# Patient Record
Sex: Male | Born: 1980 | Race: Black or African American | Hispanic: No | Marital: Married | Smoking: Current some day smoker
Health system: Southern US, Community
[De-identification: ages and names within clinical notes are randomized; demographics above are authoritative.]

---

## 2008-08-04 ENCOUNTER — Emergency Department (HOSPITAL_COMMUNITY): Admission: EM | Admit: 2008-08-04 | Discharge: 2008-08-04 | Payer: Self-pay | Admitting: Emergency Medicine

## 2009-05-15 ENCOUNTER — Emergency Department (HOSPITAL_COMMUNITY): Admission: EM | Admit: 2009-05-15 | Discharge: 2009-05-15 | Payer: Self-pay | Admitting: Emergency Medicine

## 2011-10-06 ENCOUNTER — Emergency Department (HOSPITAL_COMMUNITY)
Admission: EM | Admit: 2011-10-06 | Discharge: 2011-10-06 | Disposition: A | Discharge status: Home / Self Care | Payer: No Typology Code available for payment source | Attending: Emergency Medicine | Admitting: Emergency Medicine

## 2011-10-06 ENCOUNTER — Encounter (HOSPITAL_COMMUNITY): Payer: Self-pay | Admitting: Emergency Medicine

## 2011-10-06 ENCOUNTER — Emergency Department (HOSPITAL_COMMUNITY): Payer: No Typology Code available for payment source

## 2011-10-06 DIAGNOSIS — M542 Cervicalgia: Secondary | ICD-10-CM | POA: Insufficient documentation

## 2011-10-06 DIAGNOSIS — R51 Headache: Secondary | ICD-10-CM | POA: Insufficient documentation

## 2011-10-06 DIAGNOSIS — F17200 Nicotine dependence, unspecified, uncomplicated: Secondary | ICD-10-CM | POA: Insufficient documentation

## 2011-10-06 DIAGNOSIS — M545 Low back pain, unspecified: Secondary | ICD-10-CM | POA: Insufficient documentation

## 2011-10-06 DIAGNOSIS — M546 Pain in thoracic spine: Secondary | ICD-10-CM | POA: Insufficient documentation

## 2011-10-06 MED ORDER — HYDROCODONE-ACETAMINOPHEN 5-325 MG PO TABS: 2.0000 | ORAL_TABLET | Freq: Four times a day (QID) | ORAL | Status: AC | PRN

## 2011-10-06 MED ORDER — DIAZEPAM 5 MG PO TABS: 5.0000 mg | ORAL_TABLET | Freq: Two times a day (BID) | ORAL | Status: AC
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
## 2011-10-07 NOTE — ED Provider Notes (Signed)
Medical screening examination/treatment/procedure(s) were performed by non-physician practitioner and as supervising physician I was immediately available for consultation/collaboration.  Raeford Razor, MD 10/07/11 1210

## 2011-10-11 ENCOUNTER — Emergency Department (HOSPITAL_COMMUNITY): Payer: No Typology Code available for payment source

## 2011-10-11 ENCOUNTER — Encounter (HOSPITAL_COMMUNITY): Payer: Self-pay | Admitting: Family Medicine

## 2011-10-11 ENCOUNTER — Emergency Department (HOSPITAL_COMMUNITY)
Admission: EM | Admit: 2011-10-11 | Discharge: 2011-10-11 | Disposition: A | Discharge status: Home / Self Care | Payer: No Typology Code available for payment source | Attending: Emergency Medicine | Admitting: Emergency Medicine

## 2011-10-11 DIAGNOSIS — M549 Dorsalgia, unspecified: Secondary | ICD-10-CM

## 2011-10-11 DIAGNOSIS — H539 Unspecified visual disturbance: Secondary | ICD-10-CM | POA: Insufficient documentation

## 2011-10-11 DIAGNOSIS — R51 Headache: Secondary | ICD-10-CM

## 2011-10-11 DIAGNOSIS — R110 Nausea: Secondary | ICD-10-CM | POA: Insufficient documentation

## 2011-10-11 MED ORDER — OXYCODONE-ACETAMINOPHEN 5-325 MG PO TABS: 1.0000 | ORAL_TABLET | Freq: Four times a day (QID) | ORAL | Status: AC | PRN

## 2011-10-11 MED ORDER — OXYCODONE-ACETAMINOPHEN 5-325 MG PO TABS: 2.0000 | ORAL_TABLET | Freq: Once | ORAL | Status: DC

## 2011-10-11 MED ORDER — CYCLOBENZAPRINE HCL 10 MG PO TABS: 10.0000 mg | ORAL_TABLET | Freq: Two times a day (BID) | ORAL | Status: DC | PRN
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
## 2011-10-11 NOTE — ED Notes (Signed)
Pt ambulatory from [facility] to exam room.

## 2011-10-12 NOTE — ED Provider Notes (Signed)
Medical screening examination/treatment/procedure(s) were performed by non-physician practitioner and as supervising physician I was immediately available for consultation/collaboration. Devoria Albe, MD, FACEP   Ward Givens, MD 10/12/11 [phone]

## 2013-09-12 ENCOUNTER — Encounter (HOSPITAL_COMMUNITY): Payer: Self-pay | Admitting: Emergency Medicine

## 2013-09-12 ENCOUNTER — Emergency Department (HOSPITAL_COMMUNITY): Payer: Managed Care, Other (non HMO)

## 2013-09-12 ENCOUNTER — Emergency Department (HOSPITAL_COMMUNITY)
Admission: EM | Admit: 2013-09-12 | Discharge: 2013-09-12 | Disposition: A | Discharge status: Home / Self Care | Payer: Managed Care, Other (non HMO) | Attending: Emergency Medicine | Admitting: Emergency Medicine

## 2013-09-12 DIAGNOSIS — S46909A Unspecified injury of unspecified muscle, fascia and tendon at shoulder and upper arm level, unspecified arm, initial encounter: Secondary | ICD-10-CM | POA: Insufficient documentation

## 2013-09-12 DIAGNOSIS — Z79899 Other long term (current) drug therapy: Secondary | ICD-10-CM | POA: Insufficient documentation

## 2013-09-12 DIAGNOSIS — Y9375 Activity, martial arts: Secondary | ICD-10-CM | POA: Insufficient documentation

## 2013-09-12 DIAGNOSIS — S43005A Unspecified dislocation of left shoulder joint, initial encounter: Secondary | ICD-10-CM

## 2013-09-12 DIAGNOSIS — S4980XA Other specified injuries of shoulder and upper arm, unspecified arm, initial encounter: Secondary | ICD-10-CM | POA: Diagnosis present

## 2013-09-12 DIAGNOSIS — X500XXA Overexertion from strenuous movement or load, initial encounter: Secondary | ICD-10-CM | POA: Insufficient documentation

## 2013-09-12 DIAGNOSIS — Y929 Unspecified place or not applicable: Secondary | ICD-10-CM | POA: Diagnosis not present

## 2013-09-12 DIAGNOSIS — F17200 Nicotine dependence, unspecified, uncomplicated: Secondary | ICD-10-CM | POA: Diagnosis not present

## 2013-09-12 DIAGNOSIS — S43016A Anterior dislocation of unspecified humerus, initial encounter: Secondary | ICD-10-CM | POA: Diagnosis not present

## 2013-09-12 MED ORDER — OXYCODONE-ACETAMINOPHEN 5-325 MG PO TABS: 1.0000 | ORAL_TABLET | ORAL | Status: AC | PRN

## 2013-09-12 MED ORDER — OXYCODONE-ACETAMINOPHEN 5-325 MG PO TABS
2.0000 | ORAL_TABLET | Freq: Once | ORAL | Status: AC
  Administered 2013-09-12: 2 via ORAL
  Filled 2013-09-12: qty 2

## 2013-09-12 NOTE — ED Provider Notes (Signed)
CSN: [id number]     Arrival date & time 09/12/13  1904 History   First MD Initiated Contact with Patient 09/12/13 1936     Chief Complaint  Patient presents with  . 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 He had an acute hyperextension at the shoulder with abduction, immediate onset of pain and deformity. Denies numbness or tingling or weakness at the hand. Symptoms are constant and worse with range of motion of the left upper extremity.  The history is provided by the patient.    History reviewed. No pertinent past medical history. History reviewed. No pertinent past surgical history. No family history on file. History  Substance Use Topics  . Smoking status: Current Some Day Smoker  . Smokeless tobacco: Not on file  . Alcohol Use: Yes     Comment: socially    Review of Systems  Gastrointestinal: Negative for nausea and vomiting.  Musculoskeletal: Positive for joint swelling (L shoulder). Negative for back pain and neck pain.  Neurological: Negative for weakness and numbness.      Allergies  Aspirin  Home Medications   Prior to Admission medications   Medication Sig Start Date End Date Taking? Authorizing Provider  cyclobenzaprine (FLEXERIL) 10 MG tablet Take 1 tablet (10 mg total) by mouth 2 (two) times daily as needed for muscle spasms. 10/11/11   Tia Oliveri, PA-C  diazepam (VALIUM) 5 MG tablet Take 1 tablet (5 mg total) by mouth 2 (two) times daily. 10/06/11   Santiago Glad, PA-C  HYDROcodone-acetaminophen (NORCO/VICODIN) 5-325 MG per tablet Take 2 tablets by mouth every 6 (six) hours as needed for pain. 10/06/11   Heather Laisure, PA-C  ibuprofen (ADVIL,MOTRIN) 200 MG tablet  Take 400 mg by mouth every 6 (six) hours as needed. For pain    Historical Provider, MD  oxyCODONE-acetaminophen (PERCOCET) 5-325 MG per tablet Take 1 tablet by mouth every 4 (four) hours as needed. 09/12/13   Vida Roller, MD  oxyCODONE-acetaminophen (PERCOCET/ROXICET) 5-325 MG per tablet Take 1 tablet by mouth every 6 (six) hours as needed for pain. 10/11/11   Tia Oliveri, PA-C   BP 130/81  Pulse 69  Temp(Src) 98.3 F (36.8 C) (Oral)  Resp 17  SpO2 100% Physical Exam  Nursing note and vitals reviewed. Constitutional: He appears well-developed and well-nourished. No distress.  HENT:  Head: Normocephalic and atraumatic.  Mouth/Throat: Oropharynx is clear and moist.  Eyes: Conjunctivae are normal. Right eye exhibits no discharge. Left eye exhibits no discharge. No scleral icterus.  Cardiovascular: Normal rate.   No murmur heard. Pulmonary/Chest: Effort normal. No respiratory distress.  Musculoskeletal:  Tenderness and deformity of the left shoulder consistent with anterior inferior shoulder dislocation. Normal range of motion at elbow wrist and hand on the left.  Neurological: He is alert. Coordination normal.  Normal sensation and strength to the left upper extremity distal to the dislocation  Skin: Skin is warm and dry. No rash noted. He is not diaphoretic.    ED Course  Procedures (including critical care time) Labs Review Labs Reviewed - No data to display  Imaging Review Dg Shoulder Left  09/12/2013   CLINICAL DATA:  Status post reduction.  EXAM: LEFT SHOULDER - 2+ VIEW  COMPARISON:  Same day.  FINDINGS: Successful reduction of  anterior dislocation of proximal left humerus noted on prior exam. No fracture is noted.  IMPRESSION: Successful reduction of anterior shoulder dislocation.   Electronically Signed   By: Roque Lias M.D.   On: 09/12/2013 20:13   Dg Shoulder Left  09/12/2013   CLINICAL DATA:  Left shoulder pain  EXAM: LEFT SHOULDER - 2+ VIEW  COMPARISON:  None.  FINDINGS:  There is complete anterior subluxation of the humeral head with respect to the glenoid. No fracture.  IMPRESSION: Anterior glenohumeral dislocation.   Electronically Signed   By: Maryclare Bean M.D.   On: 09/12/2013 19:40    Reduction of dislocation Date/Time: 8:41 PM Performed by: Vida Roller Authorized by: Vida Roller Consent: Verbal consent obtained. Risks and benefits: risks, benefits and alternatives were discussed Consent given by: patient Required items: required blood products, implants, devices, and special equipment available Time out: Immediately prior to procedure a "time out" was called to verify the correct patient, procedure, equipment, support staff and site/side marked as required.  Patient sedated: No  Vitals: Vital signs were monitored during sedation. Patient tolerance: Patient tolerated the procedure well with no immediate complications. Joint: L shoulder Reduction technique: Traction / Manipulation    MDM   Final diagnoses:  Shoulder dislocation, left, initial encounter    Initial imaging confirms dislocation of the left shoulder. The patient had shoulder relocation done without sedation successfully times one attempt, post reduction films obtained to rule out Hill-Sachs deformity or other abnormality.  Sling, immobilizer, percocet  Repeat imaging without findings.  Successful dislocation reduction.  Meds given in ED:  Medications  oxyCODONE-acetaminophen (PERCOCET/ROXICET) 5-325 MG per tablet 2 tablet (2 tablets Oral Given 09/12/13 2024)    New Prescriptions   OXYCODONE-ACETAMINOPHEN (PERCOCET) 5-325 MG PER TABLET    Take 1 tablet by mouth every 4 (four) hours as needed.        Vida Roller, MD 09/12/13 2041
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
## 2013-09-12 NOTE — Discharge Instructions (Signed)
Please call your doctor for a followup appointment within 24-48 hours. When you talk to your doctor please let them know that you were seen in the emergency department and have them acquire all of your records so that they can discuss the findings with you and formulate a treatment plan to fully care for your new and ongoing problems. ° °

## 2018-08-14 ENCOUNTER — Other Ambulatory Visit: Payer: Self-pay

## 2018-08-14 DIAGNOSIS — Z20822 Contact with and (suspected) exposure to covid-19: Secondary | ICD-10-CM

## 2018-08-16 LAB — NOVEL CORONAVIRUS, NAA: SARS-CoV-2, NAA: NOT DETECTED

## 2018-09-27 ENCOUNTER — Other Ambulatory Visit: Payer: Self-pay

## 2018-09-27 DIAGNOSIS — Z20822 Contact with and (suspected) exposure to covid-19: Secondary | ICD-10-CM

## 2018-09-29 LAB — NOVEL CORONAVIRUS, NAA: SARS-CoV-2, NAA: NOT DETECTED

## 2020-09-10 ENCOUNTER — Encounter (HOSPITAL_COMMUNITY): Payer: Self-pay

## 2020-09-10 ENCOUNTER — Emergency Department (HOSPITAL_COMMUNITY)
Admission: EM | Admit: 2020-09-10 | Discharge: 2020-09-10 | Disposition: A | Discharge status: Home / Self Care | Payer: BLUE CROSS/BLUE SHIELD | Attending: Emergency Medicine | Admitting: Emergency Medicine

## 2020-09-10 ENCOUNTER — Emergency Department (HOSPITAL_COMMUNITY): Payer: BLUE CROSS/BLUE SHIELD

## 2020-09-10 ENCOUNTER — Other Ambulatory Visit: Payer: Self-pay

## 2020-09-10 DIAGNOSIS — Y990 Civilian activity done for income or pay: Secondary | ICD-10-CM | POA: Insufficient documentation

## 2020-09-10 DIAGNOSIS — S43004A Unspecified dislocation of right shoulder joint, initial encounter: Secondary | ICD-10-CM | POA: Insufficient documentation

## 2020-09-10 DIAGNOSIS — X501XXA Overexertion from prolonged static or awkward postures, initial encounter: Secondary | ICD-10-CM | POA: Insufficient documentation

## 2020-09-10 DIAGNOSIS — S4991XA Unspecified injury of right shoulder and upper arm, initial encounter: Secondary | ICD-10-CM | POA: Diagnosis present

## 2020-09-10 DIAGNOSIS — F17200 Nicotine dependence, unspecified, uncomplicated: Secondary | ICD-10-CM | POA: Diagnosis not present

## 2020-09-10 MED ORDER — CYCLOBENZAPRINE HCL 10 MG PO TABS: 10.0000 mg | ORAL_TABLET | Freq: Two times a day (BID) | ORAL | 0 refills | Status: AC | PRN

## 2020-09-10 MED ORDER — PROPOFOL 10 MG/ML IV BOLUS
1.0000 mg/kg | Freq: Once | INTRAVENOUS | Status: AC
  Administered 2020-09-10: 93.4 mg via INTRAVENOUS
  Filled 2020-09-10: qty 20

## 2020-09-10 MED ORDER — HYDROMORPHONE HCL 1 MG/ML IJ SOLN
1.0000 mg | Freq: Once | INTRAMUSCULAR | Status: AC
  Administered 2020-09-10: 1 mg via INTRAVENOUS
  Filled 2020-09-10: qty 1

## 2020-09-10 NOTE — ED Notes (Signed)
XRAY contacted per RN request for portable scan

## 2020-09-10 NOTE — ED Notes (Signed)
ED Provider at bedside. 

## 2020-09-10 NOTE — ED Notes (Signed)
Patient ready for conscious sedation for right shoulder reduction. Staff present: Reatha Armour, Ortho Tech Jamas Lav, RN Dr. Dalene Seltzer

## 2020-09-10 NOTE — ED Notes (Signed)
Pt drowsy, awake, and oriented.

## 2020-09-10 NOTE — ED Triage Notes (Signed)
Pt BIB EMS from work. Pt states he has dislocated his right shoulder, this has happened before to same shoulder.

## 2020-09-10 NOTE — ED Notes (Signed)
An After Visit Summary was printed and given to the patient. Discharge instructions given and no further questions at this time.  

## 2020-09-11 NOTE — ED Provider Notes (Signed)
[facility]-EMERGENCY DEPT Provider Note   CSN: [id number] Arrival date & time: 09/10/20  1812     History Chief Complaint  Patient presents with   Shoulder Injury    Andrew Mercado is a 40 y.o. male.  HPI     40yo male presents with concern for right shoulder pain and concern for dislocation.  2 weeks ago felt like right shoulder dislocated and went back in on its own. Today, was trying to put a picture up and pushed arm against the wall and felt right shoulder go out of place again. Pain is severe, worse with movement.  No other acute concerns. No numbness or weakness. Had left shoulder dislocated in the past.   History reviewed. No pertinent past medical history.  There are no problems to display for this patient.   History reviewed. No pertinent surgical history.     History reviewed. No pertinent family history.  Social History   Tobacco Use   Smoking status: Some Days  Substance Use Topics   Alcohol use: Yes    Comment: socially   Drug use: No    Home Medications Prior to Admission medications   Medication Sig Start Date End Date Taking? Authorizing Provider  cyclobenzaprine (FLEXERIL) 10 MG tablet Take 1 tablet (10 mg total) by mouth 2 (two) times daily as needed for muscle spasms. 09/10/20  Yes Alvira Monday, MD  diazepam (VALIUM) 5 MG tablet Take 1 tablet (5 mg total) by mouth 2 (two) times daily. 10/06/11   Santiago Glad, PA-C  HYDROcodone-acetaminophen (NORCO/VICODIN) 5-325 MG per tablet Take 2 tablets by mouth every 6 (six) hours as needed for pain. 10/06/11   Santiago Glad, PA-C  ibuprofen (ADVIL,MOTRIN) 200 MG tablet Take 400 mg by mouth every 6 (six) hours as needed. For pain    [provider]  oxyCODONE-acetaminophen (PERCOCET) 5-325 MG per tablet Take 1 tablet by mouth every 4 (four) hours as needed. 09/12/13   Eber Hong, MD  oxyCODONE-acetaminophen (PERCOCET/ROXICET) 5-325 MG per tablet Take 1 tablet by  mouth every 6 (six) hours as needed for pain. 10/11/11   Oliveri, Tia L, PA-C    Allergies    Aspirin  Review of Systems   Review of Systems  Constitutional:  Negative for fever.  HENT:  Negative for sore throat.   Respiratory:  Negative for cough and shortness of breath.   Musculoskeletal:  Positive for arthralgias. Negative for back pain.  Neurological:  Negative for headaches.   Physical Exam Updated Vital Signs BP (!) 142/79   Pulse 79   Temp 98.1 F (36.7 C) (Oral)   Resp 18   Ht 5\' 11"  (1.803 m)   Wt 93.4 kg   SpO2 99%   BMI 28.73 kg/m   Physical Exam Vitals and nursing note reviewed.  Constitutional:      General: He is not in acute distress.    Appearance: Normal appearance. He is not ill-appearing, toxic-appearing or diaphoretic.  HENT:     Head: Normocephalic.     Mouth/Throat:     Mouth: Mucous membranes are moist.     Pharynx: No oropharyngeal exudate.  Eyes:     Conjunctiva/sclera: Conjunctivae normal.  Cardiovascular:     Rate and Rhythm: Normal rate and regular rhythm.     Pulses: Normal pulses.  Pulmonary:     Effort: Pulmonary effort is normal. No respiratory distress.  Musculoskeletal:        General: Tenderness and deformity (right shoulder) present. No  signs of injury.     Cervical back: No rigidity.  Skin:    General: Skin is warm and dry.     Coloration: Skin is not jaundiced or pale.  Neurological:     General: No focal deficit present.     Mental Status: He is alert and oriented to person, place, and time.    ED Results / Procedures / Treatments   Labs (all labs ordered are listed, but only abnormal results are displayed) Labs Reviewed - No data to display  EKG None  Radiology DG Shoulder Right  Result Date: 09/10/2020 CLINICAL DATA:  Postreduction EXAM: RIGHT SHOULDER - 2+ VIEW COMPARISON:  09/10/2020 FINDINGS: Reduction of shoulder dislocation. AC joint is intact. No visible fracture fragment IMPRESSION: Reduction of anterior  shoulder dislocation Electronically Signed   By: Jasmine Pang M.D.   On: 09/10/2020 21:16   DG Shoulder Right  Result Date: 09/10/2020 CLINICAL DATA:  Pain.  Dislocated shoulder. EXAM: RIGHT SHOULDER - 2+ VIEW COMPARISON:  None. FINDINGS: Anteriorly dislocated right shoulder with query of Hill-Sachs deformity. Glenoid fracture not excluded. IMPRESSION: Anteriorly dislocated right shoulder with query of Hill-Sachs deformity. Glenoid fracture not excluded. Electronically Signed   By: Tish Frederickson M.D.   On: 09/10/2020 19:52    Procedures .Sedation  Date/Time: 09/11/2020 9:38 AM Performed by: Alvira Monday, MD Authorized by: Alvira Monday, MD   Consent:    Consent obtained:  Written   Consent given by:  Patient   Risks discussed:  Allergic reaction, dysrhythmia, inadequate sedation, nausea, vomiting, respiratory compromise necessitating ventilatory assistance and intubation, prolonged sedation necessitating reversal and prolonged hypoxia resulting in organ damage   Alternatives discussed:  Analgesia without sedation Universal protocol:    Immediately prior to procedure, a time out was called: yes     Patient identity confirmed:  Verbally with patient Indications:    Procedure performed:  Dislocation reduction   Procedure necessitating sedation performed by:  Physician performing sedation Pre-sedation assessment:    Time since last food or drink:  4   ASA classification: class 1 - normal, healthy patient     Mouth opening:  3 or more finger widths   Thyromental distance:  3 finger widths   Mallampati score:  III - soft palate, base of uvula visible   Neck mobility: normal     Pre-sedation assessments completed and reviewed: airway patency, cardiovascular function, hydration status, mental status, nausea/vomiting, pain level, respiratory function and temperature   Immediate pre-procedure details:    Reassessment: Patient reassessed immediately prior to procedure   Procedure  details (see MAR for exact dosages):    Total Provider sedation time (minutes):  20 Reduction of dislocation  Date/Time: 09/11/2020 9:40 AM Performed by: Alvira Monday, MD Authorized by: Alvira Monday, MD  Consent: Verbal consent obtained. Written consent obtained. Risks and benefits: risks, benefits and alternatives were discussed Required items: required blood products, implants, devices, and special equipment available Patient identity confirmed: verbally with patient Time out: Immediately prior to procedure a "time out" was called to verify the correct patient, procedure, equipment, support staff and site/side marked as required.  Sedation: Patient sedated: yes Sedation type: moderate (conscious) sedation Sedatives: propofol  Patient tolerance: patient tolerated the procedure well with no immediate complications     Medications Ordered in ED Medications  HYDROmorphone (DILAUDID) injection 1 mg (1 mg Intravenous Given 09/10/20 1832)  HYDROmorphone (DILAUDID) injection 1 mg (1 mg Intravenous Given 09/10/20 2007)  propofol (DIPRIVAN) 10 mg/mL bolus/IV push 93.4 mg (  93.4 mg Intravenous Given 09/10/20 2033)    ED Course  I have reviewed the triage vital signs and the nursing notes.  Pertinent labs & imaging results that were available during my care of the patient were reviewed by me and considered in my medical decision making (see chart for details).    MDM Rules/Calculators/A&P                            40yo male presents with concern for right shoulder pain and concern for dislocation.   NV intact, XR confirms dislocation.  Attempted reduction without sedation, then sedated and reduced as above. NV intact. Placed in sling and recommend Orthopedic follow up.   Final Clinical Impression(s) / ED Diagnoses Final diagnoses:  Dislocation of right shoulder joint, initial encounter    Rx / DC Orders ED Discharge Orders          Ordered    cyclobenzaprine (FLEXERIL) 10  MG tablet  2 times daily PRN        09/10/20 2232             Alvira Monday, MD 09/11/20 [phone]

## 2022-11-28 IMAGING — DX DG SHOULDER 2+V*R*
2 series · 2 of 2 positions shown · non-contrast
Comparison: 09/10/2020

CLINICAL DATA: Postreduction

EXAM:
RIGHT SHOULDER - 2+ VIEW

[shoulder axial]
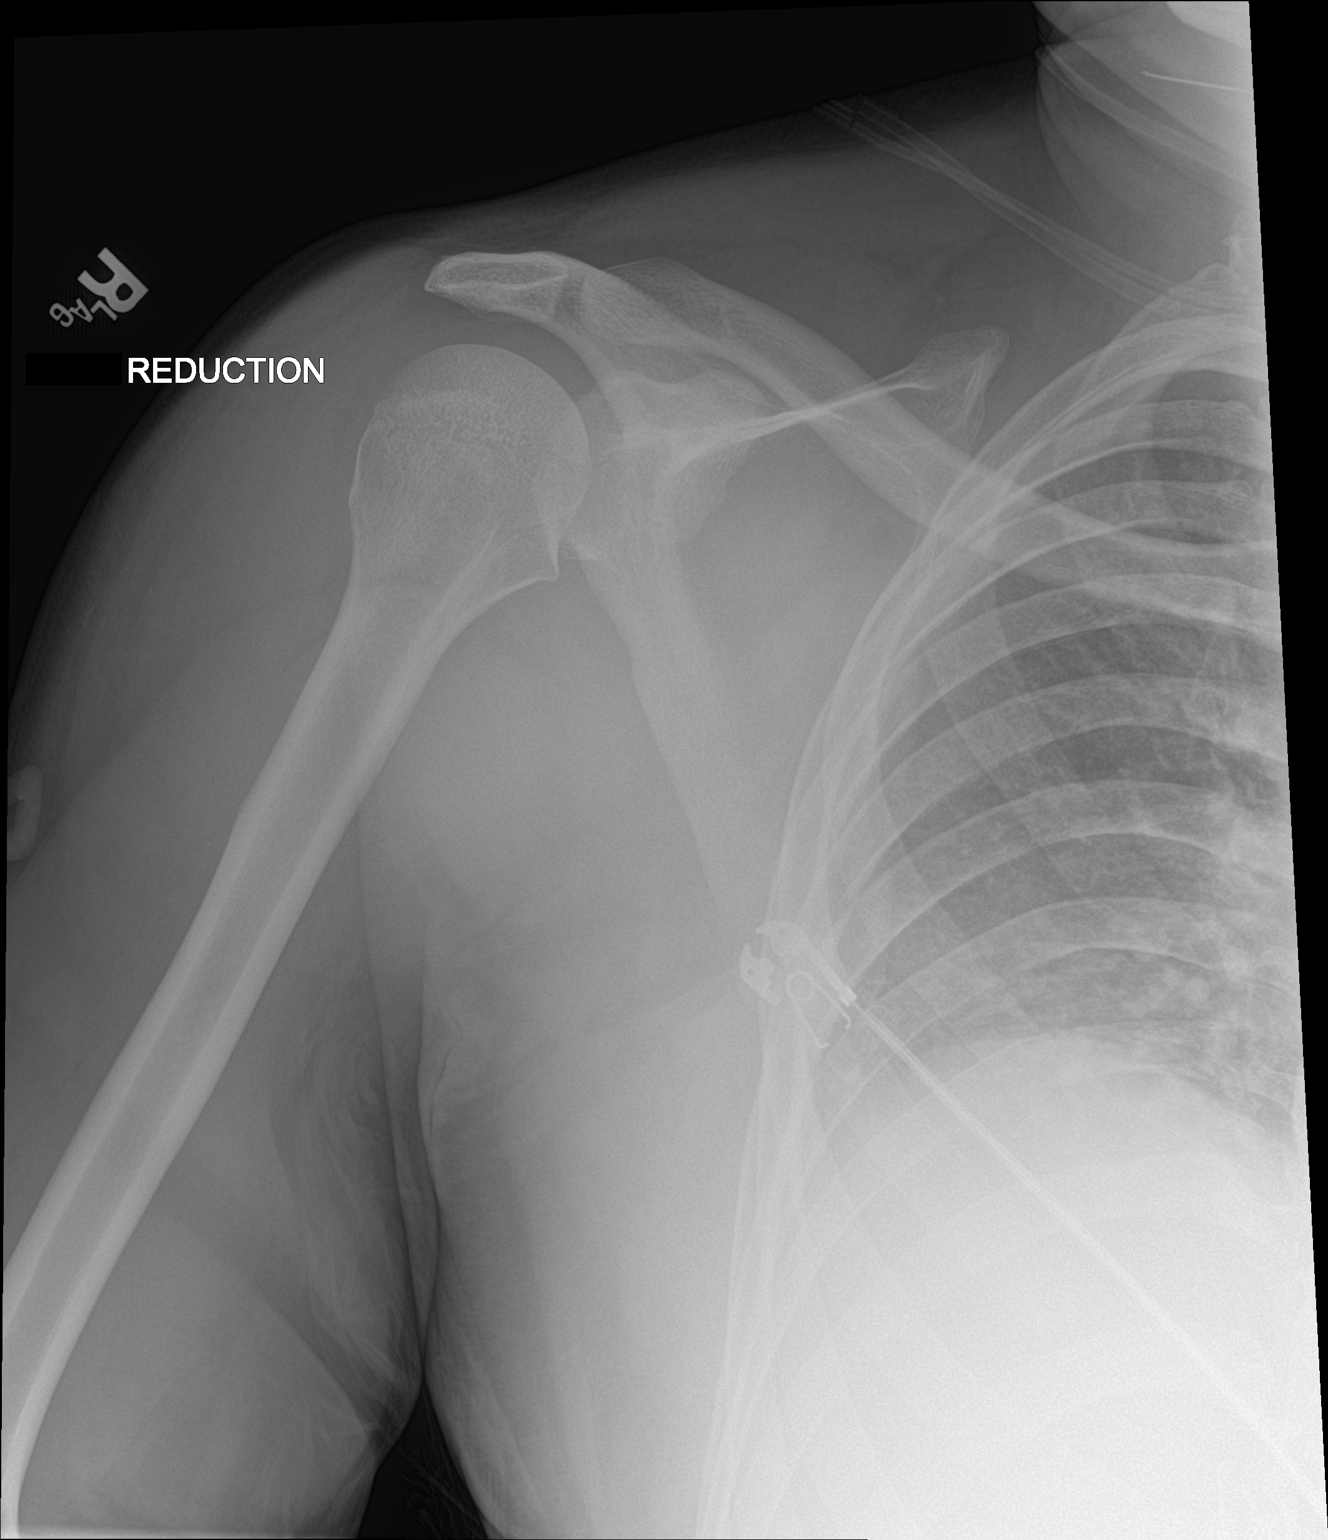

[shoulder swimmer]
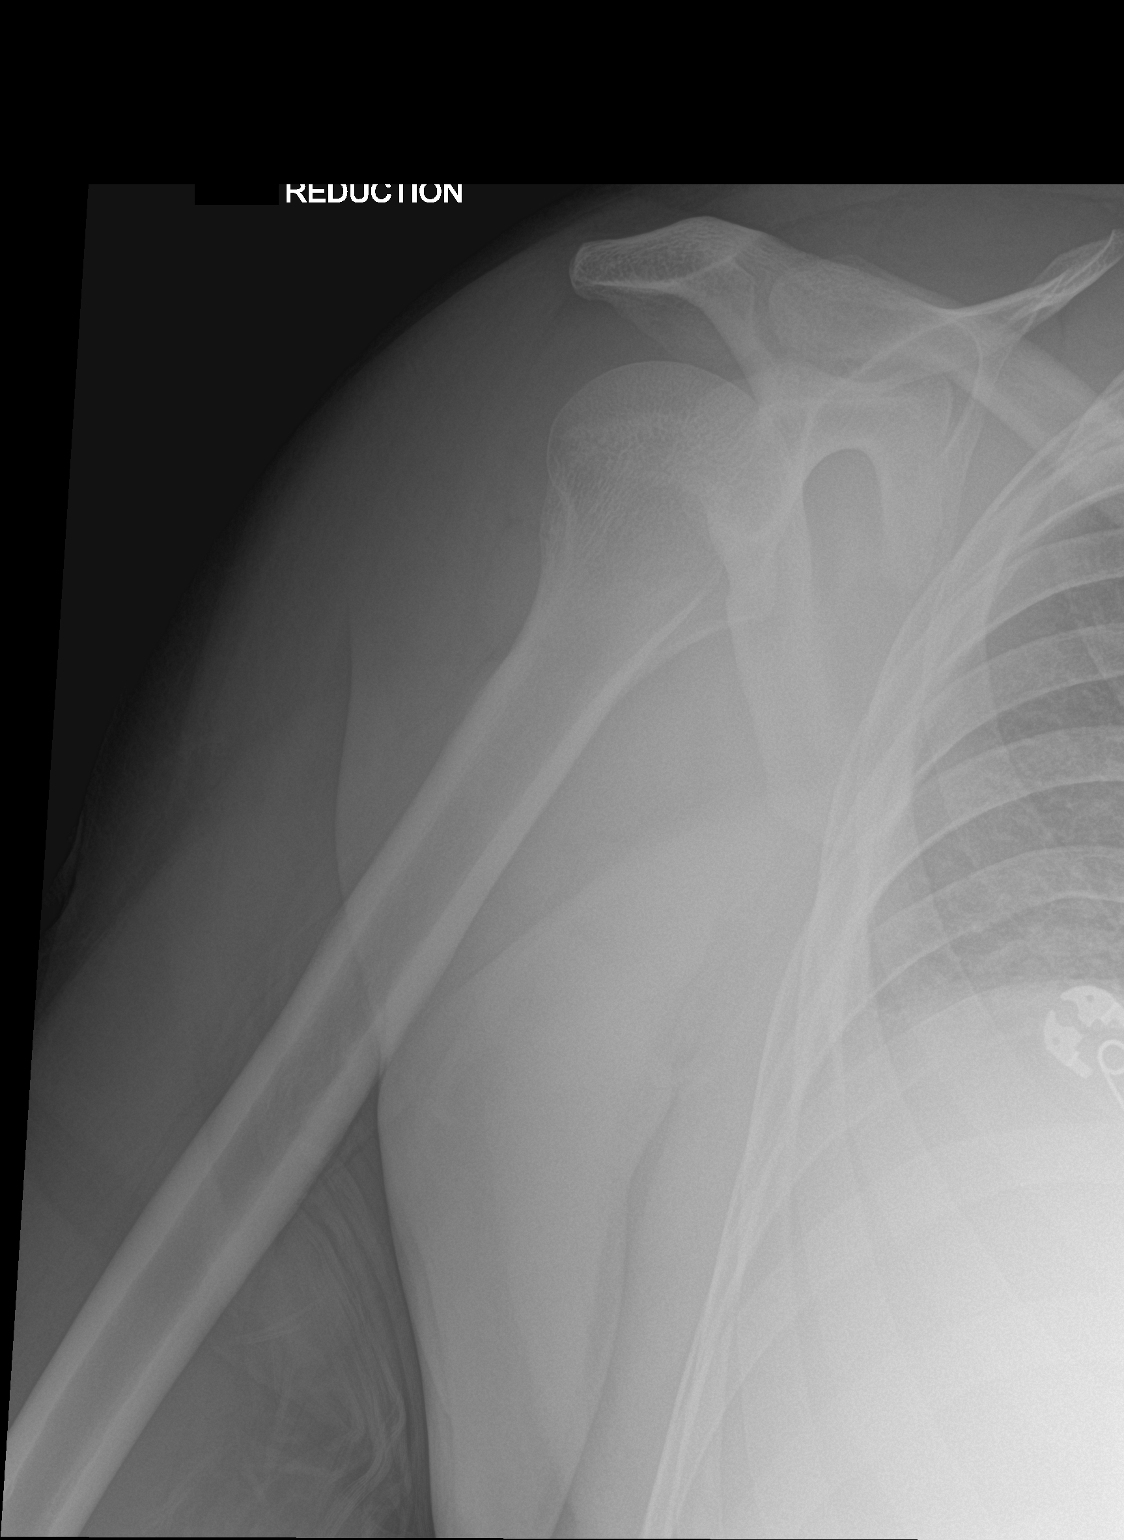

[2 of 2 positions shown; findings below may reference images not displayed]

FINDINGS: Reduction of shoulder dislocation. AC joint is intact. No visible
fracture fragment
IMPRESSION: Reduction of anterior shoulder dislocation

## 2022-11-28 IMAGING — CR DG SHOULDER 2+V*R*
3 series · 3 of 3 positions shown · non-contrast
Comparison: None.

CLINICAL DATA: Pain.  Dislocated shoulder.

EXAM:
RIGHT SHOULDER - 2+ VIEW

[x shoulder ap right (1 of 3)]
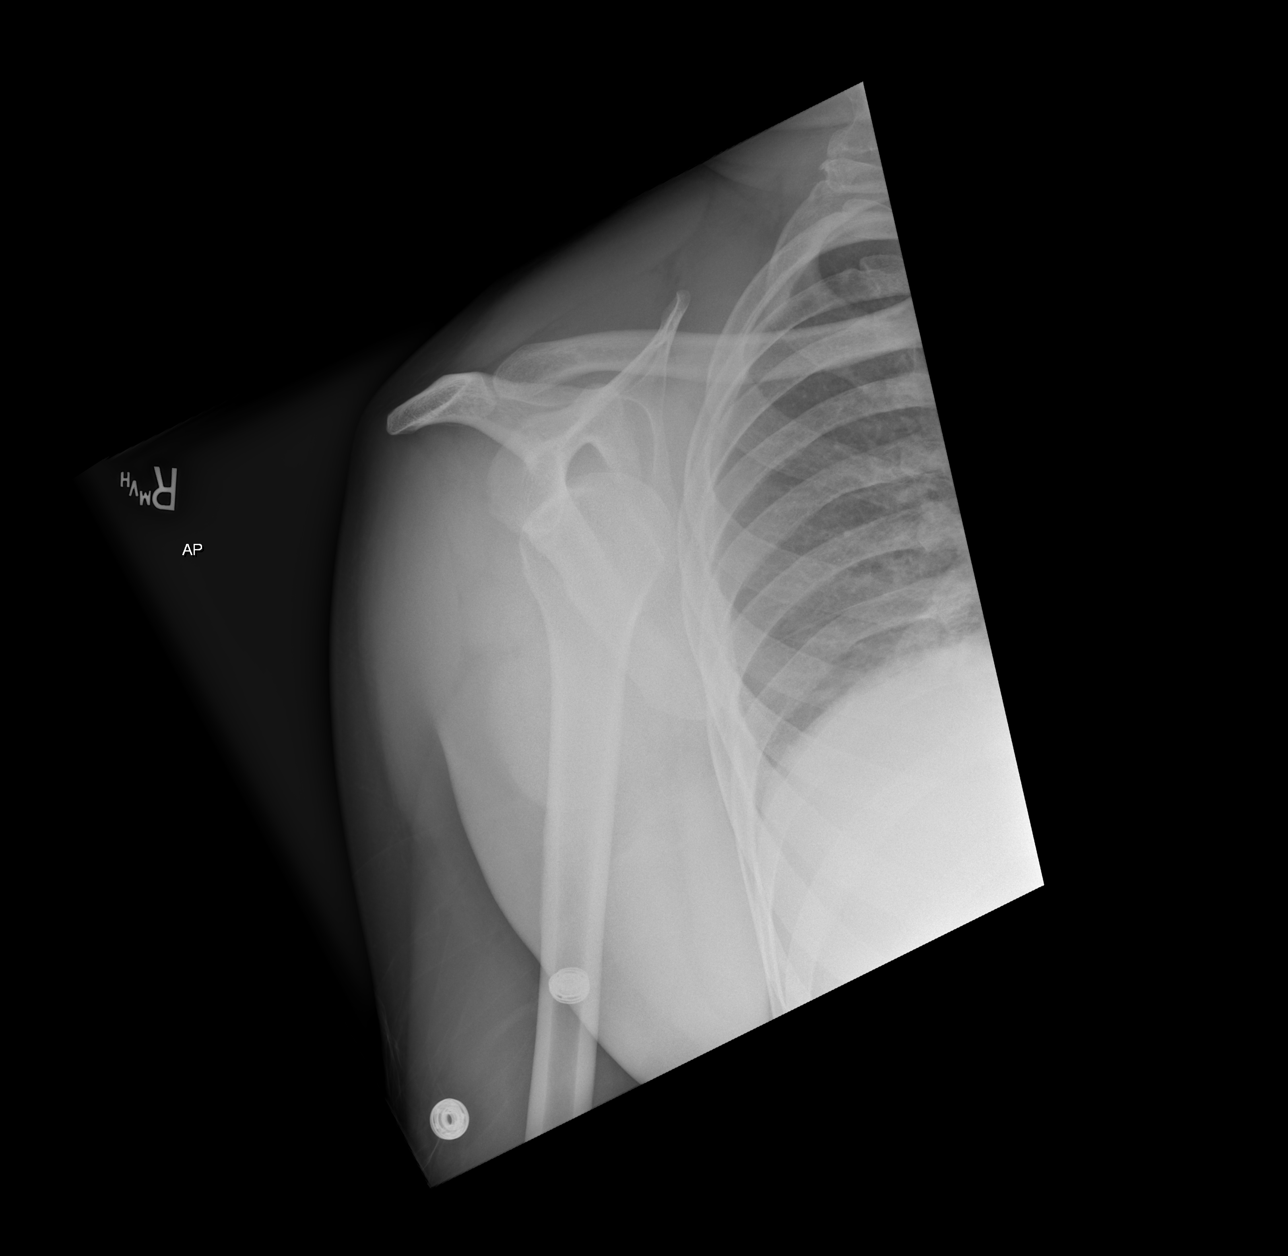

[x shoulder ap right (2 of 3)]
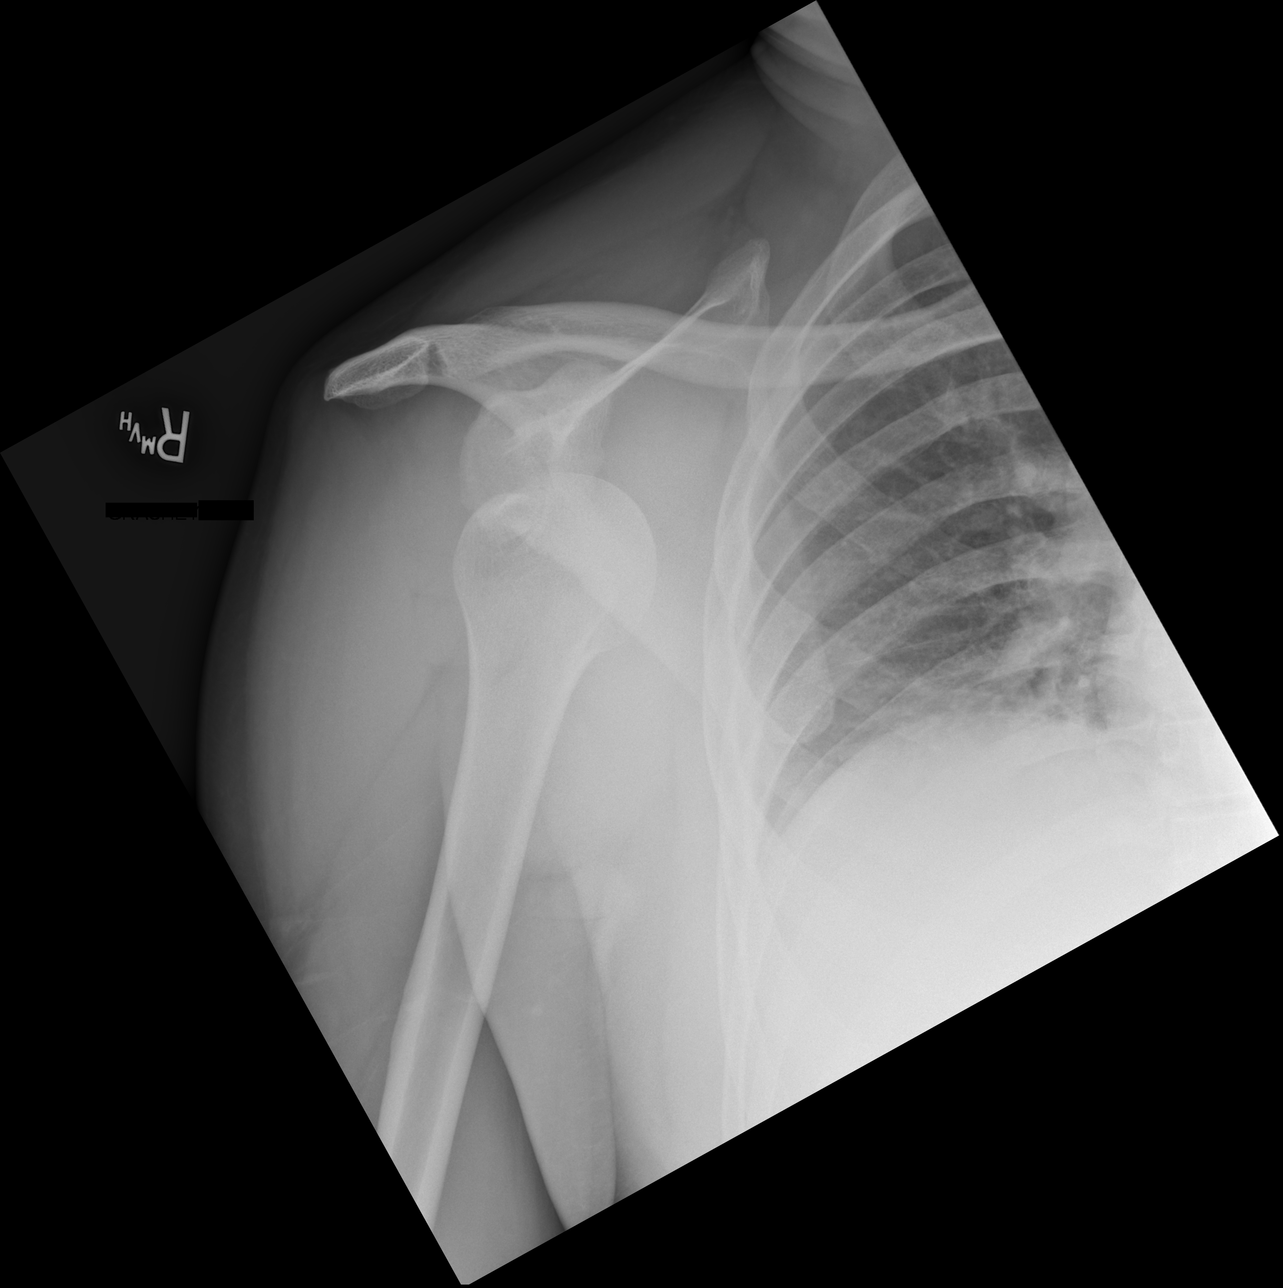

[x shoulder ap right (3 of 3)]
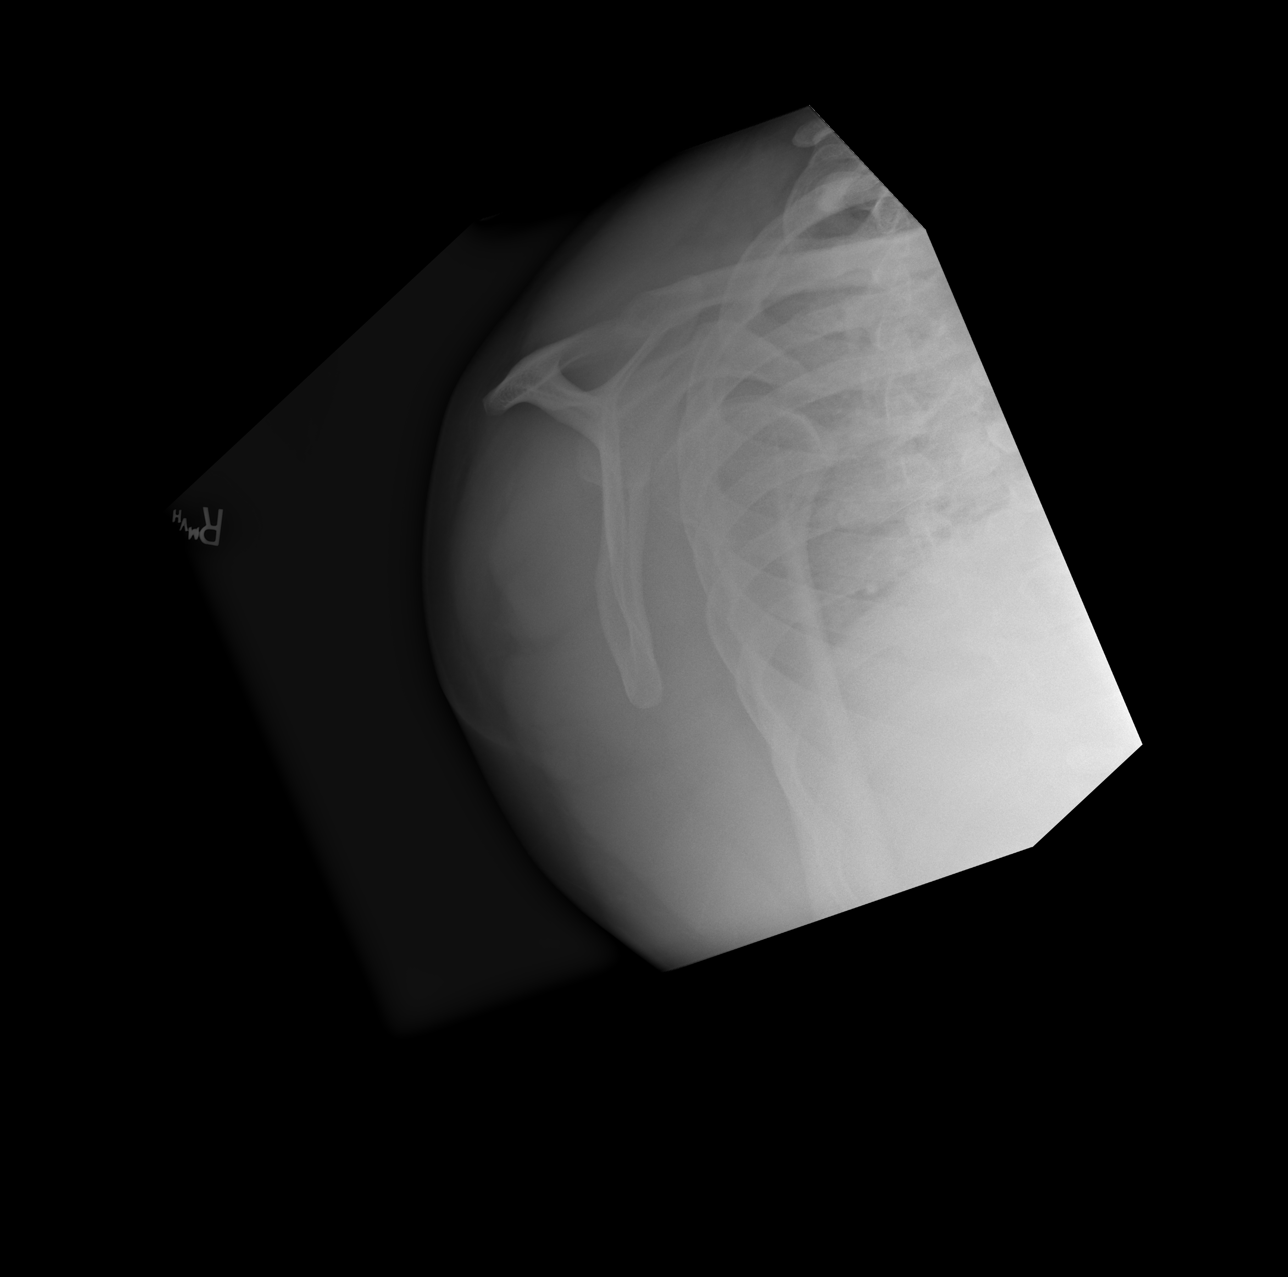

[3 of 3 positions shown; findings below may reference images not displayed]

FINDINGS: Anteriorly dislocated right shoulder with query of Hill-Sachs
deformity. Glenoid fracture not excluded.
IMPRESSION: Anteriorly dislocated right shoulder with query of Hill-Sachs
deformity. Glenoid fracture not excluded.
